# Patient Record
Sex: Female | Born: 1940 | Race: White | Hispanic: No | Marital: Married | Smoking: Former smoker
Health system: Southern US, Community
[De-identification: ages and names within clinical notes are randomized; demographics above are authoritative.]

## PROBLEM LIST (undated history)

## (undated) DIAGNOSIS — M810 Age-related osteoporosis without current pathological fracture: Secondary | ICD-10-CM

## (undated) DIAGNOSIS — C3490 Malignant neoplasm of unspecified part of unspecified bronchus or lung: Secondary | ICD-10-CM

## (undated) DIAGNOSIS — G500 Trigeminal neuralgia: Secondary | ICD-10-CM

## (undated) DIAGNOSIS — IMO0001 Reserved for inherently not codable concepts without codable children: Secondary | ICD-10-CM

## (undated) DIAGNOSIS — Z9221 Personal history of antineoplastic chemotherapy: Secondary | ICD-10-CM

## (undated) DIAGNOSIS — I10 Essential (primary) hypertension: Secondary | ICD-10-CM

## (undated) DIAGNOSIS — IMO0002 Reserved for concepts with insufficient information to code with codable children: Secondary | ICD-10-CM

## (undated) HISTORY — PX: ABDOMINAL HYSTERECTOMY: SHX81

## (undated) HISTORY — DX: Trigeminal neuralgia: G50.0

## (undated) HISTORY — DX: Essential (primary) hypertension: I10

## (undated) HISTORY — DX: Reserved for concepts with insufficient information to code with codable children: IMO0002

## (undated) HISTORY — DX: Age-related osteoporosis without current pathological fracture: M81.0

## (undated) HISTORY — PX: CATARACT EXTRACTION: SUR2

## (undated) HISTORY — PX: TONSILLECTOMY: SUR1361

## (undated) HISTORY — DX: Malignant neoplasm of unspecified part of unspecified bronchus or lung: C34.90

## (undated) HISTORY — PX: OTHER SURGICAL HISTORY: SHX169

## (undated) HISTORY — PX: BREAST LUMPECTOMY: SHX2

## (undated) HISTORY — DX: Personal history of antineoplastic chemotherapy: Z92.21

## (undated) HISTORY — DX: Reserved for inherently not codable concepts without codable children: IMO0001

---

## 2005-12-21 ENCOUNTER — Inpatient Hospital Stay: Payer: Self-pay | Admitting: Internal Medicine

## 2005-12-21 ENCOUNTER — Other Ambulatory Visit: Payer: Self-pay

## 2006-01-05 ENCOUNTER — Ambulatory Visit: Payer: Self-pay | Admitting: Internal Medicine

## 2006-09-22 ENCOUNTER — Ambulatory Visit: Payer: Self-pay | Admitting: Internal Medicine

## 2007-03-06 ENCOUNTER — Ambulatory Visit: Payer: Self-pay | Admitting: Internal Medicine

## 2008-07-08 ENCOUNTER — Ambulatory Visit: Payer: Self-pay | Admitting: Internal Medicine

## 2008-07-29 ENCOUNTER — Ambulatory Visit: Payer: Self-pay | Admitting: Internal Medicine

## 2009-08-11 ENCOUNTER — Ambulatory Visit: Payer: Self-pay | Admitting: Internal Medicine

## 2009-10-12 ENCOUNTER — Ambulatory Visit: Payer: Self-pay | Admitting: Internal Medicine

## 2009-10-21 ENCOUNTER — Ambulatory Visit: Payer: Self-pay | Admitting: Internal Medicine

## 2009-11-03 ENCOUNTER — Ambulatory Visit: Payer: Self-pay | Admitting: Internal Medicine

## 2009-11-17 ENCOUNTER — Ambulatory Visit: Payer: Self-pay | Admitting: Internal Medicine

## 2009-11-20 LAB — PATHOLOGY REPORT

## 2009-12-17 ENCOUNTER — Ambulatory Visit: Payer: Self-pay | Admitting: Oncology

## 2009-12-22 ENCOUNTER — Ambulatory Visit: Payer: Self-pay | Admitting: Oncology

## 2009-12-23 ENCOUNTER — Ambulatory Visit: Payer: Self-pay | Admitting: Oncology

## 2010-01-21 ENCOUNTER — Ambulatory Visit: Payer: Self-pay | Admitting: Oncology

## 2010-02-21 ENCOUNTER — Ambulatory Visit: Payer: Self-pay | Admitting: Oncology

## 2010-03-24 ENCOUNTER — Ambulatory Visit: Payer: Self-pay | Admitting: Oncology

## 2010-04-22 ENCOUNTER — Ambulatory Visit: Payer: Self-pay | Admitting: Oncology

## 2010-05-23 ENCOUNTER — Ambulatory Visit: Payer: Self-pay

## 2010-05-23 ENCOUNTER — Ambulatory Visit: Payer: Self-pay | Admitting: Oncology

## 2010-06-28 ENCOUNTER — Ambulatory Visit: Payer: Self-pay | Admitting: Ophthalmology

## 2010-06-28 DIAGNOSIS — I119 Hypertensive heart disease without heart failure: Secondary | ICD-10-CM

## 2010-07-05 ENCOUNTER — Ambulatory Visit: Payer: Self-pay | Admitting: Ophthalmology

## 2010-07-27 ENCOUNTER — Ambulatory Visit: Payer: Self-pay | Admitting: Oncology

## 2010-08-22 ENCOUNTER — Ambulatory Visit: Payer: Self-pay | Admitting: Oncology

## 2010-10-06 ENCOUNTER — Ambulatory Visit: Payer: Self-pay | Admitting: Oncology

## 2010-10-11 ENCOUNTER — Ambulatory Visit: Payer: Self-pay | Admitting: Oncology

## 2010-10-23 ENCOUNTER — Ambulatory Visit: Payer: Self-pay | Admitting: Oncology

## 2010-11-22 ENCOUNTER — Ambulatory Visit: Payer: Self-pay | Admitting: Oncology

## 2011-01-11 ENCOUNTER — Ambulatory Visit: Payer: Self-pay | Admitting: Oncology

## 2011-01-22 ENCOUNTER — Ambulatory Visit: Payer: Self-pay | Admitting: Oncology

## 2011-04-13 ENCOUNTER — Ambulatory Visit: Payer: Self-pay | Admitting: Oncology

## 2011-04-13 LAB — CREATININE, SERUM
Creatinine: 0.66 mg/dL (ref 0.60–1.30)
EGFR (African American): >60
EGFR (Non-African Amer.): >60

## 2011-04-14 ENCOUNTER — Ambulatory Visit: Payer: Self-pay | Admitting: Oncology

## 2011-04-18 LAB — COMPREHENSIVE METABOLIC PANEL
Bilirubin,Total: 0.4 mg/dL (ref 0.2–1.0)
Calcium, Total: 8.7 mg/dL (ref 8.5–10.1)
Chloride: 96 mmol/L — ABNORMAL LOW (ref 98–107)
Co2: 29 mmol/L (ref 21–32)
Creatinine: 0.7 mg/dL (ref 0.60–1.30)
EGFR (African American): >60
EGFR (Non-African Amer.): >60
Potassium: 3.4 mmol/L — ABNORMAL LOW (ref 3.5–5.1)
SGOT(AST): 19 U/L (ref 15–37)
SGPT (ALT): 28 U/L

## 2011-04-18 LAB — CBC CANCER CENTER
Basophil %: 0.4 %
Eosinophil %: 0.7 %
HCT: 40.8 % (ref 35.0–47.0)
HGB: 13.9 g/dL (ref 12.0–16.0)
MCH: 31.4 pg (ref 26.0–34.0)
MCHC: 34.2 g/dL (ref 32.0–36.0)
MCV: 92 fL (ref 80–100)
Monocyte %: 6.3 %
Neutrophil #: 5.1 x10 3/mm (ref 1.4–6.5)
Neutrophil %: 77.5 %
Platelet: 229 x10 3/mm (ref 150–440)
RBC: 4.44 10*6/uL (ref 3.80–5.20)
RDW: 13.2 % (ref 11.5–14.5)
WBC: 6.5 x10 3/mm (ref 3.6–11.0)

## 2011-04-22 ENCOUNTER — Ambulatory Visit: Payer: Self-pay | Admitting: Oncology

## 2011-04-23 IMAGING — CT NM PET TUM IMG LTD AREA
1 of 5 series · 9 of 25 positions shown · non-contrast
Comparison: none

REASON FOR EXAM: abn ct of chest RUL mass mildly enlarged right hilar
lymph node
COMMENTS:

[Series 3: ct wb 3.0 b30f · axial · 3.0mm · 0.98mm/px · z∈[-282,+318]mm · 9 of 376 slices shown]
[im 38/376  soft-tissue]
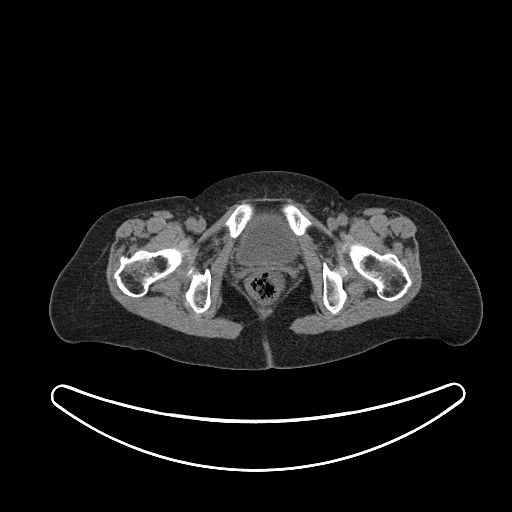
[im 76/376  soft-tissue]
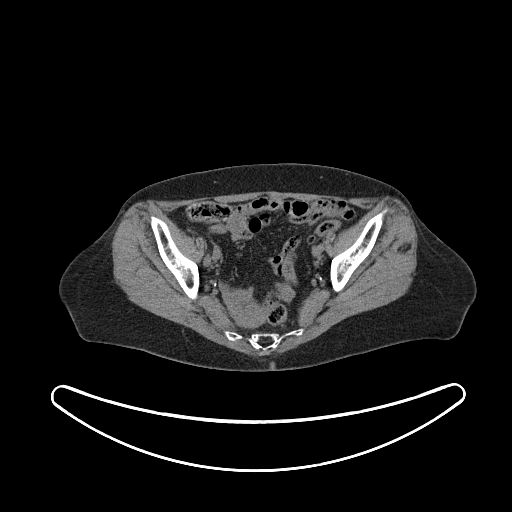
[im 113/376  soft-tissue]
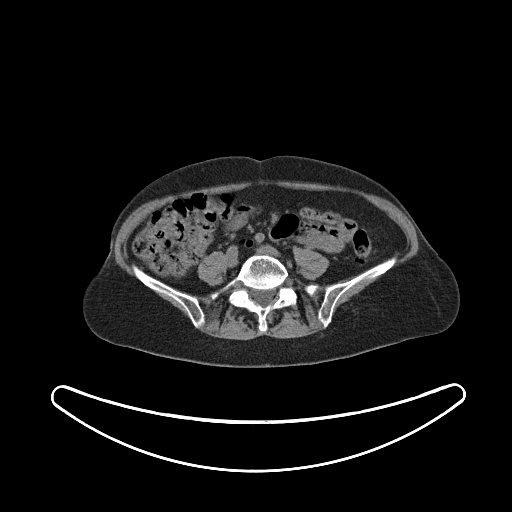
[im 151/376  soft-tissue]
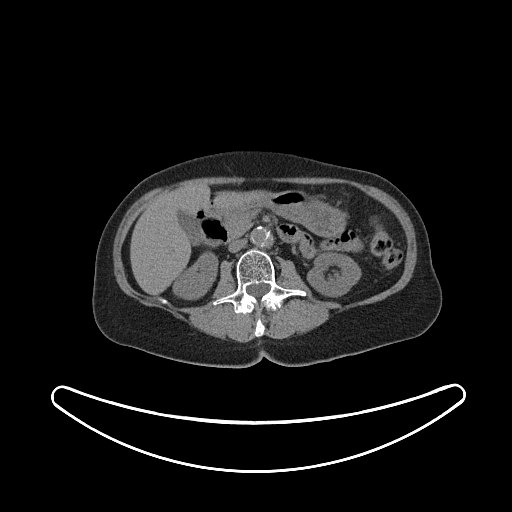
[im 188/376  soft-tissue]
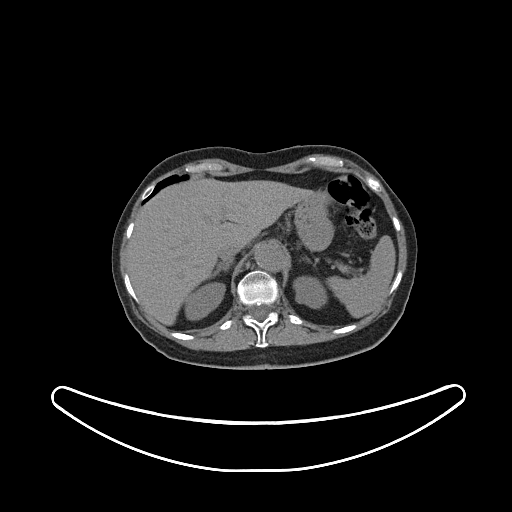
[im 226/376  soft-tissue]
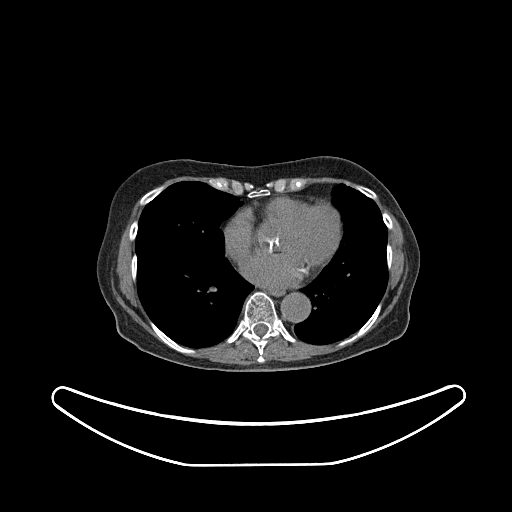
[im 263/376  soft-tissue]
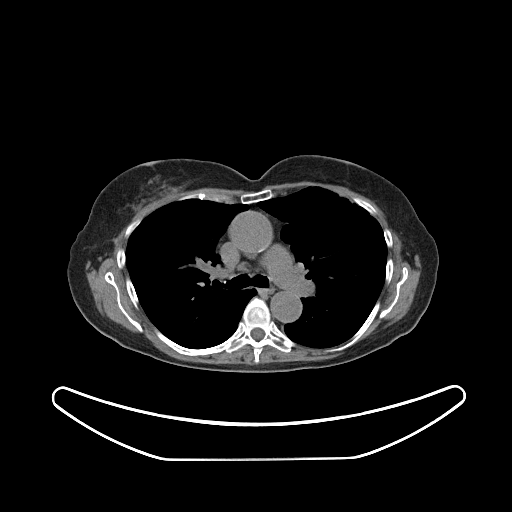
[im 301/376  soft-tissue]
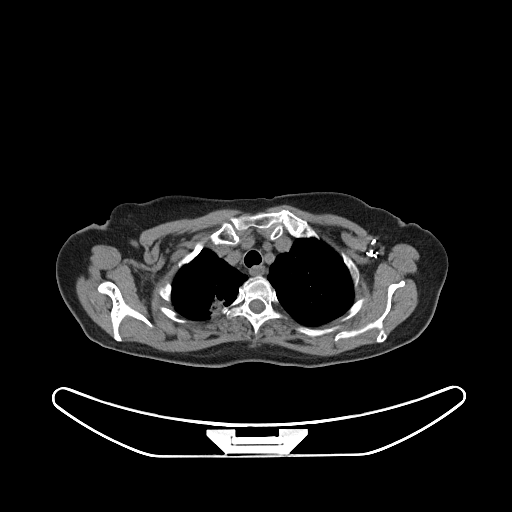
[im 338/376  soft-tissue]
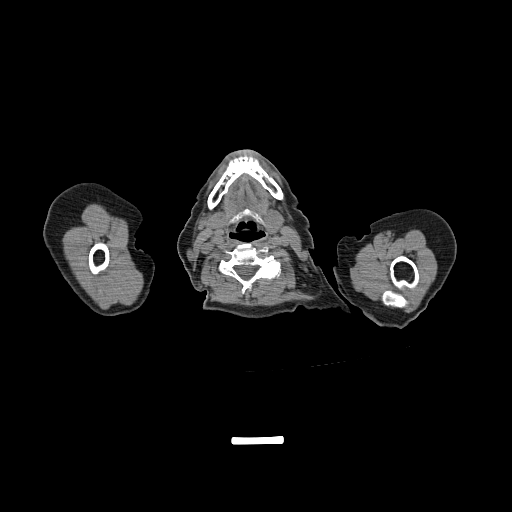

[9 of 25 positions shown; findings below may reference images not displayed]

PROCEDURE:     PET - PET/CT DX LUNG CA  - November 03, 2009 [DATE]

RESULT:     The patient is undergoing evaluation of a right upper lobe mass.
The patient's fasting blood glucose level was 105 mg/dL. The patient has a
history of breast malignancy.

The patient received 14.21 mCi of fluorine 18 labeled fluorodeoxyglucose at
[DATE] a.m. with scanning beginning at [DATE] a.m. A noncontrast CT scan was
performed at the same sitting for coregistration and attenuation correction.
On the CT images there is a 2.8 cm diameter mass posteriorly in the right
upper lobe. Elsewhere within the lungs I do not see abnormal masses. At
mediastinal window settings I do not see bulky lymph nodes.

On the PET images uptake of the radiopharmaceutical over the neck is within
the limits of normal. There is intensely increased activity within the right
upper lobe associated with the soft tissue mass on the CT images. It
exhibits a peak SUV value of 13.9 with a mean of 8.8. I do not see abnormal
uptake of the radiopharmaceutical within the mediastinum  nor hilar regions.
There is long segment low level uptake within the thoracic esophagus.

Within the abdomen there is mildly increased uptake associated with the
normal sized left adrenal gland. It exhibits a maximal SUV value of 2.8 with
a mean of 2.3. I do not see abnormal uptake within the liver. Normal
expected uptake within the kidneys and urinary bladder is seen. There is
calcification in the lateral aspect of the midpole of the right kidney which
is nonspecific. There are scattered foci of increased activity within bowel.
There may be a tiny calcified gallstone.

There is mildly increased uptake associated with the posterior elements at
the L4-L5 distal level on the left. This corresponds to an area of sclerosis
on the CT images.
IMPRESSION: 1. There is intensely increased uptake of the radiopharmaceutical within the
right upper lobe soft tissue mass. I do not see significant abnormal uptake
within the mediastinum and no enlarged lymph nodes identified on this
noncontrast study.
2. There is a tiny amount of increased uptake associated with the left
adrenal gland.
3. There is mildly increased uptake associated with the posterior elements
at the L4-L5 disc level. There is considerable degenerative change here. I
do not see evidence of a compression fracture and no lytic lesion is
identified.

## 2011-05-24 ENCOUNTER — Ambulatory Visit: Payer: Self-pay | Admitting: Ophthalmology

## 2011-06-06 ENCOUNTER — Ambulatory Visit: Payer: Self-pay | Admitting: Ophthalmology

## 2011-09-12 ENCOUNTER — Ambulatory Visit: Payer: Self-pay

## 2011-10-27 ENCOUNTER — Ambulatory Visit: Payer: Self-pay | Admitting: Oncology

## 2011-10-27 LAB — COMPREHENSIVE METABOLIC PANEL
Anion Gap: 5 — ABNORMAL LOW (ref 7–16)
Calcium, Total: 9.2 mg/dL (ref 8.5–10.1)
Chloride: 97 mmol/L — ABNORMAL LOW (ref 98–107)
EGFR (African American): >60
Potassium: 3.7 mmol/L (ref 3.5–5.1)
SGOT(AST): 18 U/L (ref 15–37)
SGPT (ALT): 25 U/L (ref 12–78)

## 2011-10-27 LAB — CBC CANCER CENTER
Basophil %: 0.6 %
Eosinophil #: 0 x10 3/mm (ref 0.0–0.7)
HGB: 13.4 g/dL (ref 12.0–16.0)
MCH: 30.5 pg (ref 26.0–34.0)
MCHC: 32.8 g/dL (ref 32.0–36.0)
Monocyte #: 0.5 x10 3/mm (ref 0.2–0.9)
Neutrophil %: 77.6 %
Platelet: 232 x10 3/mm (ref 150–440)

## 2011-11-22 ENCOUNTER — Ambulatory Visit: Payer: Self-pay | Admitting: Oncology

## 2012-01-09 ENCOUNTER — Ambulatory Visit: Payer: Self-pay | Admitting: Oncology

## 2012-01-16 ENCOUNTER — Ambulatory Visit: Payer: Self-pay | Admitting: Oncology

## 2012-01-22 ENCOUNTER — Ambulatory Visit: Payer: Self-pay | Admitting: Oncology

## 2012-02-28 ENCOUNTER — Observation Stay: Payer: Self-pay | Admitting: Student

## 2012-02-28 LAB — COMPREHENSIVE METABOLIC PANEL
Albumin: 3.9 g/dL (ref 3.4–5.0)
Alkaline Phosphatase: 136 U/L (ref 50–136)
Anion Gap: 7 (ref 7–16)
BUN: 10 mg/dL (ref 7–18)
Bilirubin,Total: 0.2 mg/dL (ref 0.2–1.0)
Calcium, Total: 8.4 mg/dL — ABNORMAL LOW (ref 8.5–10.1)
Chloride: 97 mmol/L — ABNORMAL LOW (ref 98–107)
Co2: 30 mmol/L (ref 21–32)
Creatinine: 0.69 mg/dL (ref 0.60–1.30)
EGFR (African American): >60
EGFR (Non-African Amer.): >60
Glucose: 93 mg/dL (ref 65–99)
Osmolality: 267 (ref 275–301)
SGOT(AST): 25 U/L (ref 15–37)
SGPT (ALT): 24 U/L (ref 12–78)
Total Protein: 7.4 g/dL (ref 6.4–8.2)

## 2012-02-28 LAB — URINALYSIS, COMPLETE
Bilirubin,UR: NEGATIVE
Glucose,UR: NEGATIVE mg/dL (ref 0–75)
Ketone: NEGATIVE
Nitrite: NEGATIVE
Protein: NEGATIVE
RBC,UR: 2 /HPF (ref 0–5)
Specific Gravity: 1.005 (ref 1.003–1.030)
Squamous Epithelial: 2

## 2012-02-28 LAB — MAGNESIUM: Magnesium: 2 mg/dL

## 2012-02-28 LAB — CBC
HGB: 13.5 g/dL (ref 12.0–16.0)
MCHC: 34.9 g/dL (ref 32.0–36.0)
MCV: 91 fL (ref 80–100)
Platelet: 218 10*3/uL (ref 150–440)
RBC: 4.25 10*6/uL (ref 3.80–5.20)
RDW: 12.5 % (ref 11.5–14.5)

## 2012-02-28 LAB — CLOSTRIDIUM DIFFICILE BY PCR

## 2012-02-29 LAB — CBC WITH DIFFERENTIAL/PLATELET
Basophil %: 0.6 %
Eosinophil #: 0.1 10*3/uL (ref 0.0–0.7)
Eosinophil %: 0.9 %
HGB: 12.8 g/dL (ref 12.0–16.0)
Lymphocyte #: 0.9 10*3/uL — ABNORMAL LOW (ref 1.0–3.6)
Lymphocyte %: 13.8 %
MCHC: 34.3 g/dL (ref 32.0–36.0)
MCV: 91 fL (ref 80–100)
Monocyte #: 0.6 x10 3/mm (ref 0.2–0.9)
Neutrophil #: 5.3 10*3/uL (ref 1.4–6.5)
Neutrophil %: 76.2 %
RBC: 4.13 10*6/uL (ref 3.80–5.20)
RDW: 13 % (ref 11.5–14.5)
WBC: 6.9 10*3/uL (ref 3.6–11.0)

## 2012-02-29 LAB — BASIC METABOLIC PANEL
BUN: 9 mg/dL (ref 7–18)
Chloride: 106 mmol/L (ref 98–107)
Creatinine: 0.64 mg/dL (ref 0.60–1.30)
EGFR (African American): >60
EGFR (Non-African Amer.): >60
Glucose: 95 mg/dL (ref 65–99)
Osmolality: 278 (ref 275–301)
Potassium: 3.4 mmol/L — ABNORMAL LOW (ref 3.5–5.1)

## 2012-07-13 ENCOUNTER — Ambulatory Visit: Payer: Self-pay | Admitting: Oncology

## 2012-07-18 ENCOUNTER — Ambulatory Visit: Payer: Self-pay | Admitting: Oncology

## 2012-07-18 LAB — CBC CANCER CENTER
Basophil #: 0 x10 3/mm (ref 0.0–0.1)
Basophil %: 0.5 %
HCT: 39.8 % (ref 35.0–47.0)
HGB: 13.5 g/dL (ref 12.0–16.0)
Lymphocyte %: 16.9 %
MCH: 30.8 pg (ref 26.0–34.0)
MCHC: 33.9 g/dL (ref 32.0–36.0)
Monocyte %: 6.3 %
Neutrophil #: 5.3 x10 3/mm (ref 1.4–6.5)
Neutrophil %: 75.5 %

## 2012-07-18 LAB — COMPREHENSIVE METABOLIC PANEL
Alkaline Phosphatase: 184 U/L — ABNORMAL HIGH (ref 50–136)
Anion Gap: 7 (ref 7–16)
BUN: 10 mg/dL (ref 7–18)
Calcium, Total: 9.7 mg/dL (ref 8.5–10.1)
Chloride: 97 mmol/L — ABNORMAL LOW (ref 98–107)
Creatinine: 0.79 mg/dL (ref 0.60–1.30)
EGFR (African American): >60
EGFR (Non-African Amer.): >60
Osmolality: 272 (ref 275–301)
Potassium: 5.2 mmol/L — ABNORMAL HIGH (ref 3.5–5.1)
SGOT(AST): 22 U/L (ref 15–37)
Sodium: 135 mmol/L — ABNORMAL LOW (ref 136–145)

## 2012-07-22 ENCOUNTER — Ambulatory Visit: Payer: Self-pay | Admitting: Oncology

## 2012-08-27 ENCOUNTER — Other Ambulatory Visit: Payer: Self-pay | Admitting: Neurology

## 2012-08-27 NOTE — Telephone Encounter (Signed)
Pt called is wanting her refill on her DULoxetine (CYMBALTA) 30 MG capsule I see that [facility] has sent the request over but pt would like for someone to call her to confirm that she will be able to get her refill. Thanks

## 2012-09-13 ENCOUNTER — Ambulatory Visit: Payer: Self-pay | Admitting: Oncology

## 2012-09-21 ENCOUNTER — Encounter: Payer: Self-pay | Admitting: Neurology

## 2012-09-24 ENCOUNTER — Ambulatory Visit: Payer: Self-pay | Admitting: Neurology

## 2012-11-13 ENCOUNTER — Ambulatory Visit: Payer: Self-pay | Admitting: Neurology

## 2012-12-20 ENCOUNTER — Other Ambulatory Visit: Payer: Self-pay

## 2012-12-20 MED ORDER — OXCARBAZEPINE 300 MG PO TABS: 600.0000 mg | ORAL_TABLET | Freq: Two times a day (BID) | ORAL | Status: DC

## 2012-12-20 NOTE — Telephone Encounter (Signed)
Patient called requesting a refill on Trileptal.  She requests a written Rx.  Per last note in [facility] dose is 2 bid and 1/2 at night, and she cannot go on a higher dose.  Call back number [phone].   Call Placed By: York Spaniel MD,  May 04, 2012 7:55 AM Summary of Call: I called the patient. The blood work will good, but the Trileptal dose is at the upper limits of the therapeutic range. The patient cannot go higher on this dose.

## 2012-12-21 NOTE — Telephone Encounter (Signed)
I called patient and she requested I put prescription in the mail to her. I will do that.

## 2012-12-25 ENCOUNTER — Other Ambulatory Visit: Payer: Self-pay | Admitting: Neurology

## 2012-12-26 ENCOUNTER — Other Ambulatory Visit: Payer: Self-pay | Admitting: Neurology

## 2012-12-26 MED ORDER — DULOXETINE HCL 30 MG PO CPEP: 30.0000 mg | ORAL_CAPSULE | Freq: Two times a day (BID) | ORAL | Status: DC

## 2012-12-26 NOTE — Telephone Encounter (Signed)
Pt's prescription was faxed to [facility] at [phone].

## 2012-12-26 NOTE — Telephone Encounter (Signed)
Pt's prescription was [facility]ed to [facility] [phone].

## 2013-01-12 ENCOUNTER — Emergency Department: Payer: Self-pay | Admitting: Emergency Medicine

## 2013-01-21 ENCOUNTER — Ambulatory Visit: Payer: Self-pay | Admitting: Oncology

## 2013-01-23 ENCOUNTER — Ambulatory Visit: Payer: Self-pay | Admitting: Oncology

## 2013-02-21 ENCOUNTER — Ambulatory Visit: Payer: Self-pay | Admitting: Oncology

## 2013-03-04 ENCOUNTER — Ambulatory Visit: Payer: Self-pay | Admitting: Physician Assistant

## 2013-04-03 ENCOUNTER — Other Ambulatory Visit: Payer: Self-pay | Admitting: Neurology

## 2013-04-03 MED ORDER — OXCARBAZEPINE 300 MG PO TABS: 600.0000 mg | ORAL_TABLET | Freq: Two times a day (BID) | ORAL | Status: AC

## 2013-04-03 NOTE — Telephone Encounter (Signed)
Pt is in the process of moving to [place]. She will be physically moving Monday 2-16 or Tuesday 2-17.  She would like to get a written prescription for her Trileptal 300 mg so that she will have enough to last until she gets established with a new neurologist in [place].  Please call to let her know when prescription is ready for pickup.  Thank you

## 2013-04-03 NOTE — Telephone Encounter (Signed)
Called patient to inform her that her Rx was ready to be picked up at the front desk and if she has any other problems, questions or concerns to call the office. Patient verbalized understanding. °

## 2013-04-22 ENCOUNTER — Telehealth: Payer: Self-pay | Admitting: Neurology

## 2013-04-22 MED ORDER — DULOXETINE HCL 30 MG PO CPEP: 30.0000 mg | ORAL_CAPSULE | Freq: Two times a day (BID) | ORAL | Status: AC

## 2013-04-22 NOTE — Telephone Encounter (Signed)
Patient needs refill for Cymbalta 30mg 

## 2013-04-22 NOTE — Telephone Encounter (Signed)
Patient has an appt scheduled this month.  Rx has been sent.

## 2013-05-17 ENCOUNTER — Telehealth: Payer: Self-pay | Admitting: Neurology

## 2013-05-17 ENCOUNTER — Ambulatory Visit: Payer: Self-pay | Admitting: Neurology

## 2013-05-17 NOTE — Telephone Encounter (Signed)
This patient no showed for a revisit appointment today.

## 2013-05-28 ENCOUNTER — Other Ambulatory Visit: Payer: Self-pay | Admitting: Neurology

## 2013-05-28 NOTE — Telephone Encounter (Signed)
Patient No Showed Last Appt

## 2013-06-22 ENCOUNTER — Other Ambulatory Visit: Payer: Self-pay | Admitting: Neurology

## 2013-06-23 NOTE — Telephone Encounter (Signed)
Patient has not been seen in over one year.  She has not been in to any of the three appts scheduled in Epic.

## 2013-07-30 ENCOUNTER — Ambulatory Visit: Payer: Self-pay | Admitting: Oncology

## 2014-01-08 ENCOUNTER — Encounter: Payer: Self-pay | Admitting: Neurology

## 2014-01-14 ENCOUNTER — Encounter: Payer: Self-pay | Admitting: Neurology

## 2014-06-13 NOTE — H&P (Signed)
PATIENT NAME:  Diana Rasmussen, Diana Rasmussen MR#:  [id number] DATE OF BIRTH:  1940/10/16  DATE OF ADMISSION:  02/28/2012  PRIMARY CARE PHYSICIAN: Dr. Clayborn Bigness.   ONCOLOGIST: Dr. Oliva Bustard.   REFERRING PHYSICIAN: Dr. Cinda Quest.   CHIEF COMPLAINT: Status post fall.   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 The patient woke up overnight. She wanted to walk to the bathroom and had had a fall, falling over. There are no loss of consciousness. The patient got up, went to the bathroom to pass urine. On her way back, she had another fall. She stated both times her legs just gave out. There was no loss of consciousness. She hit the bridge of her nose on the floor. She had no weakness in the upper extremities. She called EMS, which brought her here.   Of note, the patient did have multiple bowel movements yesterday, which were not normal for her. Of note, the patient had 4 to 5 loose, soft bowel movements without blood or pus. Normally, she passes stool from 0 to 2 times per day. She has also increased urine frequency but attributes it to her old age. There is no dysuria. Hospitalist services were contacted for further evaluation and management.   PAST MEDICAL HISTORY:  Hypertension, osteoporosis, COPD, trigeminal neurologia, breast cancer status post lumpectomy and lung cancer, as above.   PAST SURGICAL HISTORY: Partial hysterectomy, Gamma Knife for trigeminal neuralgia, breast lumpectomy.   SOCIAL HISTORY: Quit smoking 5 years ago. Drinks a fourth of glass of wine nightly. No other drug use. Is retired.   FAMILY HISTORY: Father with lung cancer. Mom with stomach cancer.   ALLERGIES: PERCOCET AND CODEINE.   REVIEW OF SYSTEMS: CONSTITUTIONAL:  No fever but feeling hot. No weight changes.  EYES: No blurry vision, double vision.  ENT: No tinnitus, hearing loss or allergies.  RESPIRATORY: No cough, wheezing, shortness of breath or dyspnea on exertion.  CARDIOVASCULAR: No chest pain, orthopnea or swelling in the legs. Has high blood pressure.  GASTROINTESTINAL:  No nausea, vomiting. Loose stools as above. No abdominal pain or melena or dark stools.  GENITOURINARY: Has increased frequency without any dysuria.  ENDOCRINE: No polyuria or nocturia. No thyroid problems. HEMATOLOGY/LYMPH: No anemia or easy bruising.  SKIN: No new rashes.  MUSCULOSKELETAL: Denies arthritis or redness. Did have a charley horse in the right lower extremity while I was in the room.  NEUROLOGIC: No numbness. Had lower extremity weakness. No history of CVA or TIA. Has trigeminal neuralgia.  PSYCHIATRIC: No anxiety or insomnia.   PHYSICAL EXAMINATION: VITAL SIGNS: Temperature on arrival 98.4, pulse 66, respiratory rate 18, blood pressure 145/90,  O2 sat 95%.  GENERAL: The patient is awake, alert, oriented x3, lying in bed, no obvious distress.  HEENT: Normocephalic. There is some bruising and some ecchymosis on the nasal bridge. Pupils are equal and reactive. Extraocular muscles intact. Moist mucous membranes.  NECK: No JVD. No thyroid tenderness. Supple.  CARDIOVASCULAR: S1, S2, regular rate and rhythm. No murmurs, rubs or gallops.  LUNGS: Clear to auscultation without wheezing or rhonchi.  ABDOMEN: Soft, nontender, nondistended. Hyperactive bowel sounds.  EXTREMITIES: No significant lower extremity edema.  NEUROLOGIC: Cranial nerves II through XII grossly intact. Strength is 5/5 in all extremities. Sensation is intact to  light touch.  PSYCHIATRIC: Awake, alert, oriented x 3.  Pleasant and cooperative.  SKIN: Besides the bruising on the nasal bridge, no other rashes observed.   LABORATORY DATA: BUN 10, creatinine 0.69, glucose 93, sodium 134, chloride 97. LFTs  within normal limits. Troponin negative x 2. WBC 7.1, hemoglobin 13.5, platelets 218. UA has 3+ leukocyte esterase, 3 WBC, trace bacteria.   IMAGING:  CT OF THE HEAD: No acute intracranial abnormality. X-RAY OF THE CHEST: One view showing COPD. No acute cardiopulmonary disease evident. EKG: Showing normal sinus rhythm. No acute ST elevations or depressions.   ASSESSMENT AND PLAN: We have a pleasant 74 year old female with history of trigeminal neuralgia, breast cancer, lung cancer, chronic obstructive pulmonary disease, osteoporosis status post fall x 2 without loss of consciousness with increased bowel movements yesterday.  At this point, we will admit the patient for observation for the fall and overall weakness. The patient is  neurologically intact and has a CT of the head, which is negative for acute stroke.  I would start the patient on some gentle IV fluids, check urine and stool cultures. Would check for orthostatics as well and obtain a PT consult. Would do frequent neuro checks, but I doubt this is a stroke at this point. It is possible that she has the weakness in the setting of some GI volume loss from the increased stool and possible enteritis. She also has a possible UTI with increased frequency of urination, as well as somewhat positive UA. I would also send in a stool C. diff; however, she has had no recent hospitalizations or antibiotics. Would resume the Vasotec and trigeminal neuralgia medications. Start her on heparin for DVT prophylaxis, as well as SCDs and TEDs.  THE PATIENT IS A FULL CODE.   TOTAL TIME SPENT: 50 minutes.    ____________________________ Vivien Presto, MD sa:dm D: 02/28/2012 10:27:45 ET T: 02/28/2012 12:12:19 ET JOB#: [id number]  cc: Vivien Presto, MD, <Dictator> Lavera Guise, MD Karel Jarvis [facility] MD ELECTRONICALLY SIGNED 03/10/2012 17:17

## 2014-06-13 NOTE — Discharge Summary (Signed)
PATIENT NAME:  Diana Rasmussen, Diana Rasmussen MR#:  [id number] DATE OF BIRTH:  October 08, 1940  DATE OF ADMISSION:  02/28/2012 DATE OF DISCHARGE:  02/29/2012  CONSULTANTS:  Physical therapy.   PRIMARY CARE PHYSICIAN:  Dr. Clayborn Bigness.   ONCOLOGIST:  Dr. Oliva Bustard.   CHIEF COMPLAINT:  Status post fall.   DISCHARGE DIAGNOSES: 1.  Enteritis, possibly viral.  2.  Fall, likely from volume depletion.  3.  Hypokalemia, likely from gastrointestinal losses.  4.  History of hypertension.  5.  Osteoporosis.  6.  Chronic obstructive pulmonary disease.  7.  Trigeminal neuralgia.  8.  Breast cancer, status post lumpectomy and lung cancer.   DISPOSITION:  Home.   DISCHARGE MEDICATIONS:  Vasotec 5 mg take 1-1/2 tab once a day, Trileptal 300 mg oral tab 2 tabs 2 times a day.  The patient states she takes a whole tab at 10:00 a.m. and then 1/2 of a pill at 2:00 p.m.  Gabapentin 800 mg oral tab 1 tab 3 times a day.   DIET:  Low sodium.   ACTIVITY:  As tolerated.   FOLLOWUP:  Please follow with the primary care physician within 1 to 2 weeks.   HISTORY OF PRESENT ILLNESS AND HOSPITAL COURSE:  For full details of history and physical, please see the dictation by Dr. 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  The patient felt her legs gave out and hit the bridge of her nose on the floor twice and EMS was called and the patient presented to the hospital for further evaluation and management, admitted to the hospitalist service for observation.  The patient had an uneventful hospitalization.  She did have mild hyponatremia of 134, which improved with IV fluids.  She was started on IV fluids.  She did not have any significant fever, leukocytosis or anemia.  Her LFTs were within  normal limits and troponin was negative.  She did complain of having increased urination.  Her urine cultures were negative as well as a UA which were showing 3 WBC, no nitrites, 3+ leukocyte esterace, but no dysuria.  Stool cultures were sent as the patient did have 4 to 5 loose stools a day prior, which is not normal for her.  Stool cultures have been negative as well as the Clostridium difficile.  Orthostatic vitals were checked which were negative as well.  The patient underwent a CT of the head which was negative for acute events and had an x-ray of the chest, which was also nondiagnostic.  She was not short of breath.  Physical therapy was consulted and patient ambulated with them, did well.  Her loose stools however resolved.  The patient likely had enteritis.  She had a bout of hypokalemia and that was repleted.  At this point, she has refused home health and wants to go home at this point and will be discharged with outpatient follow-up.   TOTAL TIME SPENT:  Thirty-five minutes.      ____________________________ Vivien Presto, MD sa:ea D: 02/29/2012 16:34:00 ET T: 02/29/2012 23:03:10 ET JOB#: [id number]  cc: Vivien Presto, MD, <Dictator> Vivien Presto MD ELECTRONICALLY SIGNED 03/10/2012 17:24

## 2014-06-15 NOTE — Op Note (Signed)
PATIENT NAME:  Diana Rasmussen, Diana Rasmussen MR#:  [id number] DATE OF BIRTH:  1940/06/29  DATE OF PROCEDURE:  06/06/2011  PREOPERATIVE DIAGNOSIS:  Cataract, left eye.    POSTOPERATIVE DIAGNOSIS:  Cataract, left eye.  PROCEDURE PERFORMED:  Extracapsular cataract extraction using phacoemulsification with placement of an Alcon [id number], 23.0-diopter posterior chamber lens, serial [id number].  SURGEON:  Loura Back. Christle Nolting, MD  ASSISTANT:  None.  ANESTHESIA:  4% lidocaine and 0.75% Marcaine in a 50/50 mixture with 10 units/mL of Hylenex added, given as a peribulbar.   ANESTHESIOLOGIST:  Julianne Handler, MD  COMPLICATIONS:  None.  ESTIMATED BLOOD LOSS:  Less than 1 ml.  DESCRIPTION OF PROCEDURE:  The patient was brought to the operating room and given a peribulbar block.  The patient was then prepped and draped in the usual fashion.  The vertical rectus muscles were imbricated using 5-0 silk sutures.  These sutures were then clamped to the sterile drapes as bridle sutures.  A limbal peritomy was performed extending two clock hours and hemostasis was obtained with cautery.  A partial thickness scleral groove was made at the surgical limbus and dissected anteriorly in a lamellar dissection using an Alcon crescent knife.  The anterior chamber was entered supero-temporally with a Superblade and through the lamellar dissection with a 2.6 mm keratome.  DisCoVisc was used to replace the aqueous and a continuous tear capsulorrhexis was carried out.  Hydrodissection and hydrodelineation were carried out with balanced salt and a 27 gauge canula.  The nucleus was rotated to confirm the effectiveness of the hydrodissection.  Phacoemulsification was carried out using a divide-and-conquer technique.  Total ultrasound time was 2 seconds and 19 seconds with an average power of 16.8 percent for CDE 37.44.   Irrigation/aspiration was used to remove the residual cortex.  DisCoVisc was used to inflate the capsule and  the internal incision was enlarged to 3 mm with the crescent knife.  The intraocular lens was folded and inserted into the capsular bag using the AcrySert delivery system.  Irrigation/aspiration was used to remove the residual DisCoVisc.  Miostat was injected into the anterior chamber through the paracentesis track to inflate the anterior chamber and induce miosis.  The wound was checked for leaks and none were found. The conjunctiva was closed with cautery and the bridle sutures were removed.  Two drops of 0.3% Vigamox were placed on the eye.   An eye shield was placed on the eye.  The patient was discharged to the recovery room in good condition. ____________________________ Loura Back Milburn Freeney, MD sad:slb D: 06/06/2011 13:40:41 ET T: 06/06/2011 14:37:49 ET JOB#: [id number]  cc: Remo Lipps A. Kyree Fedorko, MD, <Dictator> Martie Lee MD ELECTRONICALLY SIGNED 06/07/2011 14:00
